# Patient Record
Sex: Female | Born: 1970 | Hispanic: Yes | Marital: Married
Health system: Southern US, Community
[De-identification: ages and names within clinical notes are randomized; demographics above are authoritative.]

---

## 2019-03-25 DIAGNOSIS — M25512 Pain in left shoulder: Secondary | ICD-10-CM | POA: Diagnosis not present

## 2019-03-25 DIAGNOSIS — E041 Nontoxic single thyroid nodule: Secondary | ICD-10-CM | POA: Diagnosis not present

## 2019-03-25 DIAGNOSIS — R918 Other nonspecific abnormal finding of lung field: Secondary | ICD-10-CM | POA: Diagnosis not present

## 2019-03-25 DIAGNOSIS — K5909 Other constipation: Secondary | ICD-10-CM | POA: Diagnosis not present

## 2019-04-22 DIAGNOSIS — G609 Hereditary and idiopathic neuropathy, unspecified: Secondary | ICD-10-CM | POA: Diagnosis not present

## 2019-04-22 DIAGNOSIS — M25512 Pain in left shoulder: Secondary | ICD-10-CM | POA: Diagnosis not present

## 2019-04-22 DIAGNOSIS — K5909 Other constipation: Secondary | ICD-10-CM | POA: Diagnosis not present

## 2019-04-22 DIAGNOSIS — R918 Other nonspecific abnormal finding of lung field: Secondary | ICD-10-CM | POA: Diagnosis not present

## 2019-05-02 DIAGNOSIS — Z6832 Body mass index (BMI) 32.0-32.9, adult: Secondary | ICD-10-CM | POA: Diagnosis not present

## 2019-05-02 DIAGNOSIS — E042 Nontoxic multinodular goiter: Secondary | ICD-10-CM | POA: Diagnosis not present

## 2019-05-10 DIAGNOSIS — R918 Other nonspecific abnormal finding of lung field: Secondary | ICD-10-CM | POA: Diagnosis not present

## 2019-05-10 DIAGNOSIS — K5909 Other constipation: Secondary | ICD-10-CM | POA: Diagnosis not present

## 2019-05-10 DIAGNOSIS — Z0000 Encounter for general adult medical examination without abnormal findings: Secondary | ICD-10-CM | POA: Diagnosis not present

## 2019-05-10 DIAGNOSIS — M25512 Pain in left shoulder: Secondary | ICD-10-CM | POA: Diagnosis not present

## 2019-07-07 DIAGNOSIS — H1030 Unspecified acute conjunctivitis, unspecified eye: Secondary | ICD-10-CM | POA: Diagnosis not present

## 2019-07-07 DIAGNOSIS — K5909 Other constipation: Secondary | ICD-10-CM | POA: Diagnosis not present

## 2019-07-07 DIAGNOSIS — R918 Other nonspecific abnormal finding of lung field: Secondary | ICD-10-CM | POA: Diagnosis not present

## 2019-07-07 DIAGNOSIS — R300 Dysuria: Secondary | ICD-10-CM | POA: Diagnosis not present

## 2019-08-02 DIAGNOSIS — J028 Acute pharyngitis due to other specified organisms: Secondary | ICD-10-CM | POA: Diagnosis not present

## 2019-08-02 DIAGNOSIS — M159 Polyosteoarthritis, unspecified: Secondary | ICD-10-CM | POA: Diagnosis not present

## 2019-08-02 DIAGNOSIS — K5909 Other constipation: Secondary | ICD-10-CM | POA: Diagnosis not present

## 2019-08-02 DIAGNOSIS — R918 Other nonspecific abnormal finding of lung field: Secondary | ICD-10-CM | POA: Diagnosis not present

## 2019-08-11 DIAGNOSIS — R918 Other nonspecific abnormal finding of lung field: Secondary | ICD-10-CM | POA: Diagnosis not present

## 2019-08-11 DIAGNOSIS — J028 Acute pharyngitis due to other specified organisms: Secondary | ICD-10-CM | POA: Diagnosis not present

## 2019-08-11 DIAGNOSIS — K5909 Other constipation: Secondary | ICD-10-CM | POA: Diagnosis not present

## 2019-08-11 DIAGNOSIS — E041 Nontoxic single thyroid nodule: Secondary | ICD-10-CM | POA: Diagnosis not present

## 2019-09-06 DIAGNOSIS — R918 Other nonspecific abnormal finding of lung field: Secondary | ICD-10-CM | POA: Diagnosis not present

## 2019-09-06 DIAGNOSIS — K5909 Other constipation: Secondary | ICD-10-CM | POA: Diagnosis not present

## 2019-09-06 DIAGNOSIS — E041 Nontoxic single thyroid nodule: Secondary | ICD-10-CM | POA: Diagnosis not present

## 2019-09-06 DIAGNOSIS — Z1331 Encounter for screening for depression: Secondary | ICD-10-CM | POA: Diagnosis not present

## 2019-09-06 DIAGNOSIS — M25512 Pain in left shoulder: Secondary | ICD-10-CM | POA: Diagnosis not present

## 2019-09-23 DIAGNOSIS — Z1231 Encounter for screening mammogram for malignant neoplasm of breast: Secondary | ICD-10-CM | POA: Diagnosis not present

## 2019-12-06 DIAGNOSIS — K5909 Other constipation: Secondary | ICD-10-CM | POA: Diagnosis not present

## 2019-12-06 DIAGNOSIS — R5382 Chronic fatigue, unspecified: Secondary | ICD-10-CM | POA: Diagnosis not present

## 2019-12-06 DIAGNOSIS — R918 Other nonspecific abnormal finding of lung field: Secondary | ICD-10-CM | POA: Diagnosis not present

## 2019-12-06 DIAGNOSIS — J028 Acute pharyngitis due to other specified organisms: Secondary | ICD-10-CM | POA: Diagnosis not present

## 2019-12-06 DIAGNOSIS — M25512 Pain in left shoulder: Secondary | ICD-10-CM | POA: Diagnosis not present

## 2019-12-06 DIAGNOSIS — E041 Nontoxic single thyroid nodule: Secondary | ICD-10-CM | POA: Diagnosis not present

## 2019-12-20 DIAGNOSIS — E041 Nontoxic single thyroid nodule: Secondary | ICD-10-CM | POA: Diagnosis not present

## 2019-12-20 DIAGNOSIS — N926 Irregular menstruation, unspecified: Secondary | ICD-10-CM | POA: Diagnosis not present

## 2019-12-20 DIAGNOSIS — K5909 Other constipation: Secondary | ICD-10-CM | POA: Diagnosis not present

## 2019-12-20 DIAGNOSIS — R918 Other nonspecific abnormal finding of lung field: Secondary | ICD-10-CM | POA: Diagnosis not present

## 2019-12-20 DIAGNOSIS — M25512 Pain in left shoulder: Secondary | ICD-10-CM | POA: Diagnosis not present

## 2020-01-17 DIAGNOSIS — K5909 Other constipation: Secondary | ICD-10-CM | POA: Diagnosis not present

## 2020-01-17 DIAGNOSIS — E041 Nontoxic single thyroid nodule: Secondary | ICD-10-CM | POA: Diagnosis not present

## 2020-01-17 DIAGNOSIS — R918 Other nonspecific abnormal finding of lung field: Secondary | ICD-10-CM | POA: Diagnosis not present

## 2020-01-17 DIAGNOSIS — M25512 Pain in left shoulder: Secondary | ICD-10-CM | POA: Diagnosis not present

## 2020-02-21 DIAGNOSIS — K5909 Other constipation: Secondary | ICD-10-CM | POA: Diagnosis not present

## 2020-02-21 DIAGNOSIS — R918 Other nonspecific abnormal finding of lung field: Secondary | ICD-10-CM | POA: Diagnosis not present

## 2020-02-21 DIAGNOSIS — E041 Nontoxic single thyroid nodule: Secondary | ICD-10-CM | POA: Diagnosis not present

## 2020-02-21 DIAGNOSIS — M25512 Pain in left shoulder: Secondary | ICD-10-CM | POA: Diagnosis not present

## 2020-03-05 DIAGNOSIS — E041 Nontoxic single thyroid nodule: Secondary | ICD-10-CM | POA: Diagnosis not present

## 2020-03-05 DIAGNOSIS — E042 Nontoxic multinodular goiter: Secondary | ICD-10-CM | POA: Diagnosis not present

## 2020-03-06 DIAGNOSIS — M159 Polyosteoarthritis, unspecified: Secondary | ICD-10-CM | POA: Diagnosis not present

## 2020-03-06 DIAGNOSIS — M25552 Pain in left hip: Secondary | ICD-10-CM | POA: Diagnosis not present

## 2020-03-06 DIAGNOSIS — R918 Other nonspecific abnormal finding of lung field: Secondary | ICD-10-CM | POA: Diagnosis not present

## 2020-03-06 DIAGNOSIS — K5909 Other constipation: Secondary | ICD-10-CM | POA: Diagnosis not present

## 2021-10-07 ENCOUNTER — Other Ambulatory Visit: Payer: Self-pay | Admitting: Family Medicine

## 2021-10-07 DIAGNOSIS — E0590 Thyrotoxicosis, unspecified without thyrotoxic crisis or storm: Secondary | ICD-10-CM

## 2021-10-18 ENCOUNTER — Ambulatory Visit
Admission: RE | Admit: 2021-10-18 | Discharge: 2021-10-18 | Disposition: A | Discharge status: Home / Self Care | Payer: No Typology Code available for payment source | Source: Ambulatory Visit | Attending: Family Medicine | Admitting: Family Medicine

## 2021-10-18 DIAGNOSIS — E0590 Thyrotoxicosis, unspecified without thyrotoxic crisis or storm: Secondary | ICD-10-CM

## 2021-10-26 ENCOUNTER — Other Ambulatory Visit: Payer: Self-pay | Admitting: Family Medicine

## 2021-10-26 DIAGNOSIS — E041 Nontoxic single thyroid nodule: Secondary | ICD-10-CM

## 2021-11-10 ENCOUNTER — Other Ambulatory Visit (HOSPITAL_COMMUNITY)
Admission: RE | Admit: 2021-11-10 | Discharge: 2021-11-10 | Disposition: A | Discharge status: Home / Self Care | Payer: No Typology Code available for payment source | Source: Ambulatory Visit | Attending: Family Medicine | Admitting: Family Medicine

## 2021-11-10 ENCOUNTER — Ambulatory Visit
Admission: RE | Admit: 2021-11-10 | Discharge: 2021-11-10 | Disposition: A | Discharge status: Home / Self Care | Payer: No Typology Code available for payment source | Source: Ambulatory Visit | Attending: Family Medicine | Admitting: Family Medicine

## 2021-11-10 DIAGNOSIS — E041 Nontoxic single thyroid nodule: Secondary | ICD-10-CM | POA: Insufficient documentation

## 2021-11-11 LAB — CYTOLOGY - NON PAP

## 2022-03-08 ENCOUNTER — Other Ambulatory Visit: Payer: Self-pay

## 2022-03-08 ENCOUNTER — Emergency Department (HOSPITAL_COMMUNITY): Payer: No Typology Code available for payment source

## 2022-03-08 ENCOUNTER — Inpatient Hospital Stay (HOSPITAL_COMMUNITY)
Admission: EM | Admit: 2022-03-08 | Discharge: 2022-03-10 | DRG: 565 | Disposition: A | Discharge status: Home / Self Care | Payer: No Typology Code available for payment source | Attending: Surgery | Admitting: Surgery

## 2022-03-08 DIAGNOSIS — S20212A Contusion of left front wall of thorax, initial encounter: Secondary | ICD-10-CM | POA: Diagnosis present

## 2022-03-08 DIAGNOSIS — S2220XA Unspecified fracture of sternum, initial encounter for closed fracture: Principal | ICD-10-CM | POA: Diagnosis present

## 2022-03-08 DIAGNOSIS — R402412 Glasgow coma scale score 13-15, at arrival to emergency department: Secondary | ICD-10-CM | POA: Diagnosis present

## 2022-03-08 DIAGNOSIS — S22060A Wedge compression fracture of T7-T8 vertebra, initial encounter for closed fracture: Secondary | ICD-10-CM

## 2022-03-08 DIAGNOSIS — S92912A Unspecified fracture of left toe(s), initial encounter for closed fracture: Secondary | ICD-10-CM

## 2022-03-08 DIAGNOSIS — S301XXA Contusion of abdominal wall, initial encounter: Secondary | ICD-10-CM | POA: Diagnosis present

## 2022-03-08 DIAGNOSIS — E041 Nontoxic single thyroid nodule: Secondary | ICD-10-CM | POA: Diagnosis present

## 2022-03-08 DIAGNOSIS — S060X1A Concussion with loss of consciousness of 30 minutes or less, initial encounter: Secondary | ICD-10-CM | POA: Diagnosis present

## 2022-03-08 DIAGNOSIS — Y92410 Unspecified street and highway as the place of occurrence of the external cause: Secondary | ICD-10-CM

## 2022-03-08 DIAGNOSIS — S22069A Unspecified fracture of T7-T8 vertebra, initial encounter for closed fracture: Secondary | ICD-10-CM | POA: Diagnosis present

## 2022-03-08 DIAGNOSIS — R262 Difficulty in walking, not elsewhere classified: Secondary | ICD-10-CM | POA: Diagnosis present

## 2022-03-08 DIAGNOSIS — S1091XA Abrasion of unspecified part of neck, initial encounter: Secondary | ICD-10-CM | POA: Diagnosis present

## 2022-03-08 DIAGNOSIS — S92502A Displaced unspecified fracture of left lesser toe(s), initial encounter for closed fracture: Secondary | ICD-10-CM | POA: Diagnosis present

## 2022-03-08 LAB — COMPREHENSIVE METABOLIC PANEL
ALT: 22 U/L (ref 0–44)
AST: 24 U/L (ref 15–41)
Albumin: 3.3 g/dL — ABNORMAL LOW (ref 3.5–5.0)
Alkaline Phosphatase: 182 U/L — ABNORMAL HIGH (ref 38–126)
Anion gap: 7 (ref 5–15)
BUN: 8 mg/dL (ref 6–20)
CO2: 23 mmol/L (ref 22–32)
Calcium: 9.1 mg/dL (ref 8.9–10.3)
Chloride: 107 mmol/L (ref 98–111)
Creatinine, Ser: 0.46 mg/dL (ref 0.44–1.00)
GFR, Estimated: >60 mL/min (ref >60)
Glucose, Bld: 162 mg/dL — ABNORMAL HIGH (ref 70–99)
Potassium: 3.7 mmol/L (ref 3.5–5.1)
Sodium: 137 mmol/L (ref 135–145)
Total Bilirubin: 0.8 mg/dL (ref 0.3–1.2)
Total Protein: 6.9 g/dL (ref 6.5–8.1)

## 2022-03-08 LAB — I-STAT CHEM 8, ED
BUN: 7 mg/dL (ref 6–20)
Calcium, Ion: 1.16 mmol/L (ref 1.15–1.40)
Chloride: 103 mmol/L (ref 98–111)
Creatinine, Ser: <0.2 mg/dL — ABNORMAL LOW (ref 0.44–1.00)
Glucose, Bld: 169 mg/dL — ABNORMAL HIGH (ref 70–99)
HCT: 36 % (ref 36.0–46.0)
Hemoglobin: 12.2 g/dL (ref 12.0–15.0)
Potassium: 3.7 mmol/L (ref 3.5–5.1)
Sodium: 138 mmol/L (ref 135–145)
TCO2: 20 mmol/L — ABNORMAL LOW (ref 22–32)

## 2022-03-08 LAB — CBC
HCT: 37 % (ref 36.0–46.0)
Hemoglobin: 11.7 g/dL — ABNORMAL LOW (ref 12.0–15.0)
MCH: 24.5 pg — ABNORMAL LOW (ref 26.0–34.0)
MCHC: 31.6 g/dL (ref 30.0–36.0)
MCV: 77.4 fL — ABNORMAL LOW (ref 80.0–100.0)
Platelets: 294 10*3/uL (ref 150–400)
RBC: 4.78 MIL/uL (ref 3.87–5.11)
RDW: 15 % (ref 11.5–15.5)
WBC: 18.1 10*3/uL — ABNORMAL HIGH (ref 4.0–10.5)
nRBC: 0 % (ref 0.0–0.2)

## 2022-03-08 LAB — ETHANOL: Alcohol, Ethyl (B): <10 mg/dL (ref <10)

## 2022-03-08 LAB — SAMPLE TO BLOOD BANK

## 2022-03-08 LAB — I-STAT BETA HCG BLOOD, ED (MC, WL, AP ONLY): I-stat hCG, quantitative: <5 m[IU]/mL (ref <5)

## 2022-03-08 LAB — PROTIME-INR
INR: 1.3 — ABNORMAL HIGH (ref 0.8–1.2)
Prothrombin Time: 15.8 seconds — ABNORMAL HIGH (ref 11.4–15.2)

## 2022-03-08 LAB — LACTIC ACID, PLASMA: Lactic Acid, Venous: 2.5 mmol/L (ref 0.5–1.9)

## 2022-03-08 MED ORDER — ONDANSETRON HCL 4 MG/2ML IJ SOLN
4.0000 mg | Freq: Once | INTRAMUSCULAR | Status: AC
Start: 2022-03-08 — End: 2022-03-08
  Administered 2022-03-08: 4 mg via INTRAVENOUS
  Filled 2022-03-08: qty 2

## 2022-03-08 MED ORDER — IOHEXOL 300 MG/ML  SOLN
100.0000 mL | Freq: Once | INTRAMUSCULAR | Status: AC | PRN
  Administered 2022-03-08: 100 mL via INTRAVENOUS

## 2022-03-08 MED ORDER — MORPHINE SULFATE (PF) 4 MG/ML IV SOLN
4.0000 mg | Freq: Once | INTRAVENOUS | Status: AC
  Administered 2022-03-08: 4 mg via INTRAVENOUS
  Filled 2022-03-08: qty 1

## 2022-03-08 MED ORDER — SODIUM CHLORIDE 0.9 % IV BOLUS
1000.0000 mL | Freq: Once | INTRAVENOUS | Status: AC
  Administered 2022-03-08: 1000 mL via INTRAVENOUS
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
## 2022-03-08 NOTE — ED Notes (Signed)
Lactic acid of 2.5 reported to Dr Particia Nearing MD
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
## 2022-03-08 NOTE — ED Notes (Signed)
X-ray at bedside

## 2022-03-08 NOTE — ED Notes (Signed)
Patient transported to CT 
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
## 2022-03-09 ENCOUNTER — Emergency Department (HOSPITAL_COMMUNITY): Payer: No Typology Code available for payment source

## 2022-03-09 DIAGNOSIS — S92502A Displaced unspecified fracture of left lesser toe(s), initial encounter for closed fracture: Secondary | ICD-10-CM | POA: Diagnosis present

## 2022-03-09 DIAGNOSIS — Y92410 Unspecified street and highway as the place of occurrence of the external cause: Secondary | ICD-10-CM | POA: Diagnosis not present

## 2022-03-09 DIAGNOSIS — R262 Difficulty in walking, not elsewhere classified: Secondary | ICD-10-CM | POA: Diagnosis present

## 2022-03-09 DIAGNOSIS — S22069A Unspecified fracture of T7-T8 vertebra, initial encounter for closed fracture: Secondary | ICD-10-CM | POA: Diagnosis present

## 2022-03-09 DIAGNOSIS — E041 Nontoxic single thyroid nodule: Secondary | ICD-10-CM | POA: Diagnosis present

## 2022-03-09 DIAGNOSIS — S060X1A Concussion with loss of consciousness of 30 minutes or less, initial encounter: Secondary | ICD-10-CM | POA: Diagnosis present

## 2022-03-09 DIAGNOSIS — S20212A Contusion of left front wall of thorax, initial encounter: Secondary | ICD-10-CM | POA: Diagnosis present

## 2022-03-09 DIAGNOSIS — S2220XA Unspecified fracture of sternum, initial encounter for closed fracture: Secondary | ICD-10-CM | POA: Diagnosis present

## 2022-03-09 DIAGNOSIS — S1091XA Abrasion of unspecified part of neck, initial encounter: Secondary | ICD-10-CM | POA: Diagnosis present

## 2022-03-09 DIAGNOSIS — S301XXA Contusion of abdominal wall, initial encounter: Secondary | ICD-10-CM | POA: Diagnosis present

## 2022-03-09 DIAGNOSIS — R402412 Glasgow coma scale score 13-15, at arrival to emergency department: Secondary | ICD-10-CM | POA: Diagnosis present

## 2022-03-09 LAB — URINALYSIS, ROUTINE W REFLEX MICROSCOPIC
Bilirubin Urine: NEGATIVE
Glucose, UA: NEGATIVE mg/dL
Hgb urine dipstick: NEGATIVE
Ketones, ur: NEGATIVE mg/dL
Leukocytes,Ua: NEGATIVE
Nitrite: NEGATIVE
Protein, ur: NEGATIVE mg/dL
Specific Gravity, Urine: 1.021 (ref 1.005–1.030)
pH: 7 (ref 5.0–8.0)

## 2022-03-09 LAB — CBC
HCT: 33.9 % — ABNORMAL LOW (ref 36.0–46.0)
Hemoglobin: 11 g/dL — ABNORMAL LOW (ref 12.0–15.0)
MCH: 25 pg — ABNORMAL LOW (ref 26.0–34.0)
MCHC: 32.4 g/dL (ref 30.0–36.0)
MCV: 77 fL — ABNORMAL LOW (ref 80.0–100.0)
Platelets: 254 10*3/uL (ref 150–400)
RBC: 4.4 MIL/uL (ref 3.87–5.11)
RDW: 15.3 % (ref 11.5–15.5)
WBC: 10.3 10*3/uL (ref 4.0–10.5)
nRBC: 0 % (ref 0.0–0.2)

## 2022-03-09 LAB — HIV ANTIBODY (ROUTINE TESTING W REFLEX): HIV Screen 4th Generation wRfx: NONREACTIVE

## 2022-03-09 MED ORDER — LACTATED RINGERS IV SOLN: INTRAVENOUS | Status: DC

## 2022-03-09 MED ORDER — KETOROLAC TROMETHAMINE 15 MG/ML IJ SOLN
30.0000 mg | Freq: Four times a day (QID) | INTRAMUSCULAR | Status: DC
  Administered 2022-03-09 – 2022-03-10 (×6): 30 mg via INTRAVENOUS
  Filled 2022-03-09 (×6): qty 2

## 2022-03-09 MED ORDER — MORPHINE SULFATE (PF) 4 MG/ML IV SOLN: 4.0000 mg | INTRAVENOUS | Status: DC | PRN

## 2022-03-09 MED ORDER — METHOCARBAMOL 500 MG PO TABS
1000.0000 mg | ORAL_TABLET | Freq: Three times a day (TID) | ORAL | Status: DC
  Administered 2022-03-09 – 2022-03-10 (×5): 1000 mg via ORAL
  Filled 2022-03-09 (×5): qty 2

## 2022-03-09 MED ORDER — LORAZEPAM 2 MG/ML IJ SOLN
1.0000 mg | Freq: Once | INTRAMUSCULAR | Status: AC
  Administered 2022-03-09: 1 mg via INTRAVENOUS
  Filled 2022-03-09: qty 1

## 2022-03-09 MED ORDER — DOCUSATE SODIUM 100 MG PO CAPS
100.0000 mg | ORAL_CAPSULE | Freq: Two times a day (BID) | ORAL | Status: DC
  Administered 2022-03-09 – 2022-03-10 (×3): 100 mg via ORAL
  Filled 2022-03-09 (×3): qty 1

## 2022-03-09 MED ORDER — MORPHINE SULFATE (PF) 4 MG/ML IV SOLN
4.0000 mg | Freq: Once | INTRAVENOUS | Status: AC
  Administered 2022-03-09: 4 mg via INTRAVENOUS
  Filled 2022-03-09: qty 1

## 2022-03-09 MED ORDER — ONDANSETRON 4 MG PO TBDP
4.0000 mg | ORAL_TABLET | Freq: Four times a day (QID) | ORAL | Status: DC | PRN
  Administered 2022-03-10: 4 mg via ORAL
  Filled 2022-03-09: qty 1

## 2022-03-09 MED ORDER — SODIUM CHLORIDE 0.9 % IV SOLN: Freq: Once | INTRAVENOUS | Status: AC

## 2022-03-09 MED ORDER — ACETAMINOPHEN 500 MG PO TABS
1000.0000 mg | ORAL_TABLET | Freq: Four times a day (QID) | ORAL | Status: DC
Start: 2022-03-09 — End: 2022-03-10
  Administered 2022-03-09 – 2022-03-10 (×6): 1000 mg via ORAL
  Filled 2022-03-09 (×6): qty 2

## 2022-03-09 MED ORDER — ENOXAPARIN SODIUM 30 MG/0.3ML IJ SOSY
30.0000 mg | PREFILLED_SYRINGE | Freq: Two times a day (BID) | INTRAMUSCULAR | Status: DC
  Administered 2022-03-10: 30 mg via SUBCUTANEOUS
  Filled 2022-03-09: qty 0.3

## 2022-03-09 MED ORDER — KETOROLAC TROMETHAMINE 15 MG/ML IJ SOLN
15.0000 mg | Freq: Once | INTRAMUSCULAR | Status: AC
  Administered 2022-03-09: 15 mg via INTRAVENOUS
  Filled 2022-03-09: qty 1

## 2022-03-09 MED ORDER — ONDANSETRON HCL 4 MG/2ML IJ SOLN: 4.0000 mg | Freq: Four times a day (QID) | INTRAMUSCULAR | Status: DC | PRN

## 2022-03-09 MED ORDER — OXYCODONE HCL 5 MG PO TABS
5.0000 mg | ORAL_TABLET | ORAL | Status: DC | PRN
  Administered 2022-03-09: 5 mg via ORAL
  Administered 2022-03-10 (×2): 10 mg via ORAL
  Filled 2022-03-09: qty 1
  Filled 2022-03-09 (×3): qty 2

## 2022-03-09 MED ORDER — SODIUM CHLORIDE 0.9 % IV BOLUS: 1000.0000 mL | Freq: Once | INTRAVENOUS | Status: DC

## 2022-03-09 MED ORDER — OXYCODONE-ACETAMINOPHEN 5-325 MG PO TABS
1.0000 | ORAL_TABLET | Freq: Once | ORAL | Status: AC
  Administered 2022-03-09: 1 via ORAL
  Filled 2022-03-09: qty 1

## 2022-03-09 MED ORDER — LORAZEPAM 2 MG/ML IJ SOLN
0.5000 mg | Freq: Once | INTRAMUSCULAR | Status: AC
  Administered 2022-03-09: 0.5 mg via INTRAVENOUS
  Filled 2022-03-09: qty 1

## 2022-03-09 NOTE — ED Notes (Signed)
X-ray at bedside
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
## 2022-03-09 NOTE — Progress Notes (Signed)
Orthopedic Tech Progress Note Patient Details:  Katherine George 1970-08-20 [id number]  Ortho Devices Type of Ortho Device: Thoracolumbar corset (TLSO), Postop shoe/boot Ortho Device/Splint Location: LLE Ortho Device/Splint Interventions: Ordered, Application, Adjustment   Post Interventions Patient Tolerated: Well Instructions Provided: Adjustment of device, Care of device Verbal order for TLSO received from RN. TLSO and POS fitted to patient, TLSO not yet applied because the patient is still on the cardiac monitor and she also requested to not have the POS on while in bed. [place] A Gerilyn Pilgrim 03/09/2022, 1:33 AM
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
## 2022-03-09 NOTE — ED Notes (Signed)
Patient ambulated down the hallway with assistance.  During ambulation patient heart rate reached to 142 and maintained 135-138 during exercise, oxygen levels noted to drop to 85%RA, RR 35-maintained a range between 89-90%. Patient tolerated ambulation well, expressed feeling worse pain when getting up to a sitting and standing position.   ED provider aware.

## 2022-03-10 ENCOUNTER — Other Ambulatory Visit (HOSPITAL_COMMUNITY): Payer: Self-pay

## 2022-03-10 MED ORDER — METHOCARBAMOL 500 MG PO TABS
1000.0000 mg | ORAL_TABLET | Freq: Three times a day (TID) | ORAL | 0 refills | Status: AC | PRN
  Filled 2022-03-10: qty 80, 14d supply, fill #0

## 2022-03-10 MED ORDER — ACETAMINOPHEN 500 MG PO TABS
1000.0000 mg | ORAL_TABLET | Freq: Four times a day (QID) | ORAL | 0 refills | Status: AC
  Filled 2022-03-10: qty 30, 4d supply, fill #0

## 2022-03-10 MED ORDER — DOCUSATE SODIUM 100 MG PO CAPS
100.0000 mg | ORAL_CAPSULE | Freq: Two times a day (BID) | ORAL | 0 refills | Status: AC
  Filled 2022-03-10: qty 10, 5d supply, fill #0

## 2022-03-10 MED ORDER — OXYCODONE HCL 10 MG PO TABS
10.0000 mg | ORAL_TABLET | Freq: Four times a day (QID) | ORAL | 0 refills | Status: AC | PRN
  Filled 2022-03-10: qty 25, 7d supply, fill #0
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
## 2022-03-10 NOTE — Progress Notes (Signed)
Orthopedic Tech Progress Note Patient Details:  Katherine George 01-Sep-1970 [id number]  Patient has HANGER TLSO BACK BRACE  Patient ID: [name], female   DOB: August 06, 1971, 51 y.o.   MRN: [id number]  [name] 03/10/2022, 12:04 PM
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
## 2022-03-10 NOTE — Progress Notes (Signed)
AVS reviewed with patient including when to call provider, follow up appointments, and medications.  All questions answered.  Patient waiting for daughter to come to take her home and bring clothes.  She is also waiting for DME rolling walker and BSC and TOC pharmacy medications.  PIV and telemetry removed.  Patient alert and oriented on room air. Can be taken to private vehicle via wheelchair.  Patient will need assistance into car.

## 2022-03-17 ENCOUNTER — Ambulatory Visit (INDEPENDENT_AMBULATORY_CARE_PROVIDER_SITE_OTHER): Payer: Self-pay | Admitting: Orthopaedic Surgery

## 2022-03-17 DIAGNOSIS — S92919A Unspecified fracture of unspecified toe(s), initial encounter for closed fracture: Secondary | ICD-10-CM

## 2022-03-17 DIAGNOSIS — S92912A Unspecified fracture of left toe(s), initial encounter for closed fracture: Secondary | ICD-10-CM
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
## 2022-03-20 ENCOUNTER — Other Ambulatory Visit (HOSPITAL_BASED_OUTPATIENT_CLINIC_OR_DEPARTMENT_OTHER): Payer: Self-pay | Admitting: Orthopaedic Surgery

## 2022-03-20 ENCOUNTER — Telehealth: Payer: Self-pay | Admitting: Orthopaedic Surgery

## 2022-03-20 MED ORDER — MELOXICAM 15 MG PO TABS: 15.0000 mg | ORAL_TABLET | Freq: Every day | ORAL | 2 refills | Status: AC

## 2022-03-20 NOTE — Telephone Encounter (Signed)
Pt called stating she was suppose to be prescribe antiinflammatory meds.Please call pt about this matter at [phone]

## 2022-03-22 ENCOUNTER — Ambulatory Visit: Payer: No Typology Code available for payment source | Attending: General Surgery | Admitting: Physical Therapy

## 2022-03-22 ENCOUNTER — Encounter: Payer: Self-pay | Admitting: Physical Therapy

## 2022-03-22 ENCOUNTER — Other Ambulatory Visit: Payer: Self-pay

## 2022-03-22 DIAGNOSIS — M25572 Pain in left ankle and joints of left foot: Secondary | ICD-10-CM | POA: Insufficient documentation

## 2022-03-22 DIAGNOSIS — R2681 Unsteadiness on feet: Secondary | ICD-10-CM | POA: Insufficient documentation

## 2022-03-22 DIAGNOSIS — M546 Pain in thoracic spine: Secondary | ICD-10-CM | POA: Diagnosis present

## 2022-03-22 DIAGNOSIS — Y929 Unspecified place or not applicable: Secondary | ICD-10-CM | POA: Insufficient documentation

## 2022-03-22 DIAGNOSIS — R2689 Other abnormalities of gait and mobility: Secondary | ICD-10-CM | POA: Insufficient documentation

## 2022-03-22 DIAGNOSIS — Y939 Activity, unspecified: Secondary | ICD-10-CM | POA: Insufficient documentation
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
## 2022-04-05 ENCOUNTER — Ambulatory Visit: Payer: No Typology Code available for payment source

## 2022-04-05 DIAGNOSIS — R2681 Unsteadiness on feet: Secondary | ICD-10-CM

## 2022-04-05 DIAGNOSIS — M546 Pain in thoracic spine: Secondary | ICD-10-CM | POA: Diagnosis not present

## 2022-04-05 DIAGNOSIS — M25572 Pain in left ankle and joints of left foot: Secondary | ICD-10-CM

## 2022-04-05 DIAGNOSIS — R2689 Other abnormalities of gait and mobility: Secondary | ICD-10-CM
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
## 2022-04-07 ENCOUNTER — Ambulatory Visit: Payer: No Typology Code available for payment source

## 2022-04-07 DIAGNOSIS — M546 Pain in thoracic spine: Secondary | ICD-10-CM | POA: Diagnosis not present

## 2022-04-07 DIAGNOSIS — M25572 Pain in left ankle and joints of left foot: Secondary | ICD-10-CM

## 2022-04-07 DIAGNOSIS — R2681 Unsteadiness on feet: Secondary | ICD-10-CM

## 2022-04-07 DIAGNOSIS — R2689 Other abnormalities of gait and mobility: Secondary | ICD-10-CM
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
## 2022-04-11 ENCOUNTER — Ambulatory Visit: Payer: No Typology Code available for payment source

## 2022-04-11 DIAGNOSIS — R2689 Other abnormalities of gait and mobility: Secondary | ICD-10-CM

## 2022-04-11 DIAGNOSIS — M546 Pain in thoracic spine: Secondary | ICD-10-CM | POA: Diagnosis not present

## 2022-04-11 DIAGNOSIS — M25572 Pain in left ankle and joints of left foot: Secondary | ICD-10-CM

## 2022-04-11 DIAGNOSIS — R2681 Unsteadiness on feet: Secondary | ICD-10-CM
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
## 2022-04-13 ENCOUNTER — Ambulatory Visit: Payer: No Typology Code available for payment source

## 2022-04-13 DIAGNOSIS — R2681 Unsteadiness on feet: Secondary | ICD-10-CM

## 2022-04-13 DIAGNOSIS — M25572 Pain in left ankle and joints of left foot: Secondary | ICD-10-CM

## 2022-04-13 DIAGNOSIS — M546 Pain in thoracic spine: Secondary | ICD-10-CM

## 2022-04-13 DIAGNOSIS — R2689 Other abnormalities of gait and mobility: Secondary | ICD-10-CM
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
## 2022-04-18 ENCOUNTER — Ambulatory Visit: Payer: Self-pay | Attending: General Surgery

## 2022-04-18 DIAGNOSIS — M546 Pain in thoracic spine: Secondary | ICD-10-CM | POA: Insufficient documentation

## 2022-04-18 DIAGNOSIS — R2681 Unsteadiness on feet: Secondary | ICD-10-CM | POA: Insufficient documentation

## 2022-04-18 DIAGNOSIS — R2689 Other abnormalities of gait and mobility: Secondary | ICD-10-CM | POA: Insufficient documentation

## 2022-04-18 DIAGNOSIS — M25572 Pain in left ankle and joints of left foot: Secondary | ICD-10-CM | POA: Insufficient documentation
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
## 2022-04-20 ENCOUNTER — Ambulatory Visit: Payer: Self-pay

## 2022-04-20 DIAGNOSIS — R2689 Other abnormalities of gait and mobility: Secondary | ICD-10-CM

## 2022-04-20 DIAGNOSIS — R2681 Unsteadiness on feet: Secondary | ICD-10-CM

## 2022-04-20 DIAGNOSIS — M25572 Pain in left ankle and joints of left foot: Secondary | ICD-10-CM

## 2022-04-20 DIAGNOSIS — M546 Pain in thoracic spine: Secondary | ICD-10-CM
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
## 2022-04-25 ENCOUNTER — Ambulatory Visit: Payer: Self-pay

## 2022-04-25 DIAGNOSIS — M25572 Pain in left ankle and joints of left foot: Secondary | ICD-10-CM

## 2022-04-25 DIAGNOSIS — M546 Pain in thoracic spine: Secondary | ICD-10-CM

## 2022-04-25 DIAGNOSIS — R2681 Unsteadiness on feet: Secondary | ICD-10-CM

## 2022-04-25 DIAGNOSIS — R2689 Other abnormalities of gait and mobility: Secondary | ICD-10-CM
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
## 2022-04-27 ENCOUNTER — Telehealth: Payer: Self-pay

## 2022-04-27 ENCOUNTER — Ambulatory Visit: Payer: Self-pay
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
## 2022-04-28 ENCOUNTER — Ambulatory Visit (HOSPITAL_BASED_OUTPATIENT_CLINIC_OR_DEPARTMENT_OTHER): Payer: Self-pay | Admitting: Orthopaedic Surgery

## 2022-04-28 ENCOUNTER — Ambulatory Visit (INDEPENDENT_AMBULATORY_CARE_PROVIDER_SITE_OTHER): Payer: Self-pay | Admitting: Orthopaedic Surgery

## 2022-04-28 DIAGNOSIS — S92919A Unspecified fracture of unspecified toe(s), initial encounter for closed fracture: Secondary | ICD-10-CM

## 2022-04-28 DIAGNOSIS — S92912A Unspecified fracture of left toe(s), initial encounter for closed fracture: Secondary | ICD-10-CM
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
## 2022-05-02 ENCOUNTER — Ambulatory Visit: Payer: Self-pay

## 2022-05-02 DIAGNOSIS — M25572 Pain in left ankle and joints of left foot: Secondary | ICD-10-CM

## 2022-05-02 DIAGNOSIS — R2681 Unsteadiness on feet: Secondary | ICD-10-CM

## 2022-05-02 DIAGNOSIS — M546 Pain in thoracic spine: Secondary | ICD-10-CM

## 2022-05-02 DIAGNOSIS — R2689 Other abnormalities of gait and mobility: Secondary | ICD-10-CM
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
## 2022-05-09 ENCOUNTER — Ambulatory Visit: Payer: Self-pay

## 2022-05-09 DIAGNOSIS — M25572 Pain in left ankle and joints of left foot: Secondary | ICD-10-CM

## 2022-05-09 DIAGNOSIS — R2681 Unsteadiness on feet: Secondary | ICD-10-CM

## 2022-05-09 DIAGNOSIS — R2689 Other abnormalities of gait and mobility: Secondary | ICD-10-CM

## 2022-05-09 DIAGNOSIS — M546 Pain in thoracic spine: Secondary | ICD-10-CM
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
## 2022-05-11 ENCOUNTER — Ambulatory Visit: Payer: Self-pay

## 2022-05-11 DIAGNOSIS — R2681 Unsteadiness on feet: Secondary | ICD-10-CM

## 2022-05-11 DIAGNOSIS — M546 Pain in thoracic spine: Secondary | ICD-10-CM

## 2022-05-11 DIAGNOSIS — M25572 Pain in left ankle and joints of left foot: Secondary | ICD-10-CM

## 2022-05-11 DIAGNOSIS — R2689 Other abnormalities of gait and mobility: Secondary | ICD-10-CM
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
## 2022-05-18 ENCOUNTER — Ambulatory Visit: Payer: Self-pay | Attending: General Surgery

## 2022-05-18 DIAGNOSIS — R2689 Other abnormalities of gait and mobility: Secondary | ICD-10-CM | POA: Insufficient documentation

## 2022-05-18 DIAGNOSIS — M25572 Pain in left ankle and joints of left foot: Secondary | ICD-10-CM | POA: Insufficient documentation

## 2022-05-18 DIAGNOSIS — R2681 Unsteadiness on feet: Secondary | ICD-10-CM | POA: Insufficient documentation

## 2022-05-18 DIAGNOSIS — M546 Pain in thoracic spine: Secondary | ICD-10-CM | POA: Insufficient documentation
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
## 2022-05-24 ENCOUNTER — Other Ambulatory Visit: Payer: Self-pay | Admitting: Student

## 2022-05-24 DIAGNOSIS — S22060A Wedge compression fracture of T7-T8 vertebra, initial encounter for closed fracture: Secondary | ICD-10-CM

## 2022-05-24 DIAGNOSIS — M5412 Radiculopathy, cervical region: Secondary | ICD-10-CM

## 2022-05-31 ENCOUNTER — Ambulatory Visit: Payer: Self-pay

## 2022-05-31 DIAGNOSIS — M546 Pain in thoracic spine: Secondary | ICD-10-CM

## 2022-05-31 DIAGNOSIS — R2681 Unsteadiness on feet: Secondary | ICD-10-CM

## 2022-05-31 DIAGNOSIS — M25572 Pain in left ankle and joints of left foot: Secondary | ICD-10-CM

## 2022-05-31 DIAGNOSIS — R2689 Other abnormalities of gait and mobility: Secondary | ICD-10-CM
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
## 2022-05-31 NOTE — Addendum Note (Signed)
Addended by: Jefm Petty on: 05/31/2022 06:31 PM   Modules accepted: Orders

## 2022-06-06 ENCOUNTER — Ambulatory Visit: Payer: Self-pay

## 2022-06-06 DIAGNOSIS — M25572 Pain in left ankle and joints of left foot: Secondary | ICD-10-CM

## 2022-06-06 DIAGNOSIS — M546 Pain in thoracic spine: Secondary | ICD-10-CM

## 2022-06-06 DIAGNOSIS — R2689 Other abnormalities of gait and mobility: Secondary | ICD-10-CM

## 2022-06-06 DIAGNOSIS — R2681 Unsteadiness on feet: Secondary | ICD-10-CM
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
## 2022-06-07 ENCOUNTER — Ambulatory Visit
Admission: RE | Admit: 2022-06-07 | Discharge: 2022-06-07 | Disposition: A | Discharge status: Home / Self Care | Payer: Self-pay | Source: Ambulatory Visit | Attending: Student | Admitting: Student

## 2022-06-07 ENCOUNTER — Ambulatory Visit
Admission: RE | Admit: 2022-06-07 | Discharge: 2022-06-07 | Disposition: A | Discharge status: Home / Self Care | Payer: No Typology Code available for payment source | Source: Ambulatory Visit | Attending: Student | Admitting: Student

## 2022-06-07 DIAGNOSIS — S22060A Wedge compression fracture of T7-T8 vertebra, initial encounter for closed fracture: Secondary | ICD-10-CM

## 2022-06-07 DIAGNOSIS — M5412 Radiculopathy, cervical region: Secondary | ICD-10-CM

## 2022-06-13 ENCOUNTER — Ambulatory Visit: Payer: Self-pay

## 2022-06-13 DIAGNOSIS — M25572 Pain in left ankle and joints of left foot: Secondary | ICD-10-CM

## 2022-06-13 DIAGNOSIS — R2689 Other abnormalities of gait and mobility: Secondary | ICD-10-CM

## 2022-06-13 DIAGNOSIS — M546 Pain in thoracic spine: Secondary | ICD-10-CM

## 2022-06-13 DIAGNOSIS — R2681 Unsteadiness on feet: Secondary | ICD-10-CM
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
## 2022-06-20 ENCOUNTER — Ambulatory Visit: Payer: Self-pay | Attending: General Surgery

## 2022-06-20 DIAGNOSIS — R2681 Unsteadiness on feet: Secondary | ICD-10-CM | POA: Insufficient documentation

## 2022-06-20 DIAGNOSIS — M546 Pain in thoracic spine: Secondary | ICD-10-CM | POA: Insufficient documentation

## 2022-06-20 DIAGNOSIS — M25572 Pain in left ankle and joints of left foot: Secondary | ICD-10-CM | POA: Insufficient documentation

## 2022-06-20 DIAGNOSIS — R2689 Other abnormalities of gait and mobility: Secondary | ICD-10-CM | POA: Insufficient documentation
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
## 2022-06-28 ENCOUNTER — Ambulatory Visit: Payer: Self-pay

## 2022-06-28 DIAGNOSIS — R2689 Other abnormalities of gait and mobility: Secondary | ICD-10-CM

## 2022-06-28 DIAGNOSIS — M25572 Pain in left ankle and joints of left foot: Secondary | ICD-10-CM

## 2022-06-28 DIAGNOSIS — M546 Pain in thoracic spine: Secondary | ICD-10-CM

## 2022-06-28 DIAGNOSIS — R2681 Unsteadiness on feet: Secondary | ICD-10-CM
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
## 2022-07-05 ENCOUNTER — Ambulatory Visit: Payer: Self-pay

## 2022-07-05 DIAGNOSIS — R2681 Unsteadiness on feet: Secondary | ICD-10-CM

## 2022-07-05 DIAGNOSIS — R2689 Other abnormalities of gait and mobility: Secondary | ICD-10-CM

## 2022-07-05 DIAGNOSIS — M546 Pain in thoracic spine: Secondary | ICD-10-CM
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
## 2022-07-13 ENCOUNTER — Ambulatory Visit: Payer: Self-pay

## 2022-07-13 DIAGNOSIS — R2689 Other abnormalities of gait and mobility: Secondary | ICD-10-CM

## 2022-07-13 DIAGNOSIS — M25572 Pain in left ankle and joints of left foot: Secondary | ICD-10-CM

## 2022-07-13 DIAGNOSIS — R2681 Unsteadiness on feet: Secondary | ICD-10-CM

## 2022-07-13 DIAGNOSIS — M546 Pain in thoracic spine: Secondary | ICD-10-CM
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
## 2022-07-18 ENCOUNTER — Ambulatory Visit: Payer: Self-pay | Attending: General Surgery

## 2022-07-18 DIAGNOSIS — M25572 Pain in left ankle and joints of left foot: Secondary | ICD-10-CM | POA: Insufficient documentation

## 2022-07-18 DIAGNOSIS — R2689 Other abnormalities of gait and mobility: Secondary | ICD-10-CM | POA: Insufficient documentation

## 2022-07-18 DIAGNOSIS — M546 Pain in thoracic spine: Secondary | ICD-10-CM | POA: Insufficient documentation

## 2022-07-18 DIAGNOSIS — R2681 Unsteadiness on feet: Secondary | ICD-10-CM | POA: Insufficient documentation
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
## 2022-07-25 ENCOUNTER — Ambulatory Visit: Payer: Self-pay | Admitting: Physical Therapy

## 2022-07-25 ENCOUNTER — Encounter: Payer: Self-pay | Admitting: Physical Therapy

## 2022-07-25 DIAGNOSIS — M25572 Pain in left ankle and joints of left foot: Secondary | ICD-10-CM

## 2022-07-25 DIAGNOSIS — R2681 Unsteadiness on feet: Secondary | ICD-10-CM

## 2022-07-25 DIAGNOSIS — R2689 Other abnormalities of gait and mobility: Secondary | ICD-10-CM

## 2022-07-25 DIAGNOSIS — M546 Pain in thoracic spine: Secondary | ICD-10-CM
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
## 2022-08-02 ENCOUNTER — Encounter: Payer: Self-pay | Admitting: Physical Therapy

## 2022-08-02 ENCOUNTER — Ambulatory Visit: Payer: Self-pay | Admitting: Physical Therapy

## 2022-08-02 DIAGNOSIS — M25572 Pain in left ankle and joints of left foot: Secondary | ICD-10-CM

## 2022-08-02 DIAGNOSIS — R2689 Other abnormalities of gait and mobility: Secondary | ICD-10-CM

## 2022-08-02 DIAGNOSIS — R2681 Unsteadiness on feet: Secondary | ICD-10-CM

## 2022-08-02 DIAGNOSIS — M546 Pain in thoracic spine: Secondary | ICD-10-CM
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
## 2022-08-11 ENCOUNTER — Ambulatory Visit: Payer: Self-pay

## 2022-08-11 DIAGNOSIS — M25572 Pain in left ankle and joints of left foot: Secondary | ICD-10-CM

## 2022-08-11 DIAGNOSIS — M546 Pain in thoracic spine: Secondary | ICD-10-CM

## 2022-08-11 DIAGNOSIS — R2681 Unsteadiness on feet: Secondary | ICD-10-CM

## 2022-08-11 DIAGNOSIS — R2689 Other abnormalities of gait and mobility: Secondary | ICD-10-CM
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
## 2022-08-15 ENCOUNTER — Ambulatory Visit: Payer: Self-pay | Attending: General Surgery

## 2022-08-15 DIAGNOSIS — M546 Pain in thoracic spine: Secondary | ICD-10-CM | POA: Insufficient documentation

## 2022-08-15 DIAGNOSIS — R2681 Unsteadiness on feet: Secondary | ICD-10-CM | POA: Insufficient documentation

## 2022-08-15 DIAGNOSIS — R2689 Other abnormalities of gait and mobility: Secondary | ICD-10-CM | POA: Insufficient documentation

## 2022-08-15 DIAGNOSIS — M25572 Pain in left ankle and joints of left foot: Secondary | ICD-10-CM | POA: Insufficient documentation
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
## 2022-08-22 ENCOUNTER — Ambulatory Visit: Payer: Self-pay

## 2022-08-22 DIAGNOSIS — M546 Pain in thoracic spine: Secondary | ICD-10-CM

## 2022-08-22 DIAGNOSIS — R2681 Unsteadiness on feet: Secondary | ICD-10-CM

## 2022-08-22 DIAGNOSIS — M25572 Pain in left ankle and joints of left foot: Secondary | ICD-10-CM

## 2022-08-22 DIAGNOSIS — R2689 Other abnormalities of gait and mobility: Secondary | ICD-10-CM
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
## 2022-08-29 ENCOUNTER — Ambulatory Visit: Payer: Self-pay

## 2022-09-05 ENCOUNTER — Ambulatory Visit: Payer: Self-pay

## 2022-09-05 DIAGNOSIS — R2681 Unsteadiness on feet: Secondary | ICD-10-CM

## 2022-09-05 DIAGNOSIS — M25572 Pain in left ankle and joints of left foot: Secondary | ICD-10-CM

## 2022-09-05 DIAGNOSIS — M546 Pain in thoracic spine: Secondary | ICD-10-CM

## 2022-09-05 DIAGNOSIS — R2689 Other abnormalities of gait and mobility: Secondary | ICD-10-CM
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
## 2022-09-12 ENCOUNTER — Ambulatory Visit: Payer: Self-pay

## 2023-02-08 IMAGING — US US THYROID
1 series · 13 of 25 positions shown · non-contrast
Comparison: None.

CLINICAL DATA: Other. Thyrotoxicosis without thyroid storm.
Right-sided thyroid enlargement.

EXAM:
THYROID ULTRASOUND
TECHNIQUE: Ultrasound examination of the thyroid gland and adjacent soft
tissues was performed.

[Series 1: us thyroid · 0.06mm/px · 13 of 47 slices shown]
[im 1/47]
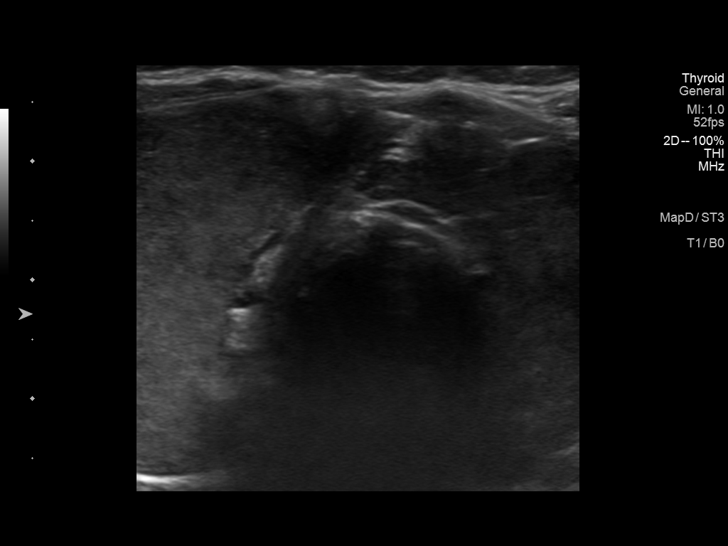
[im 4/47]
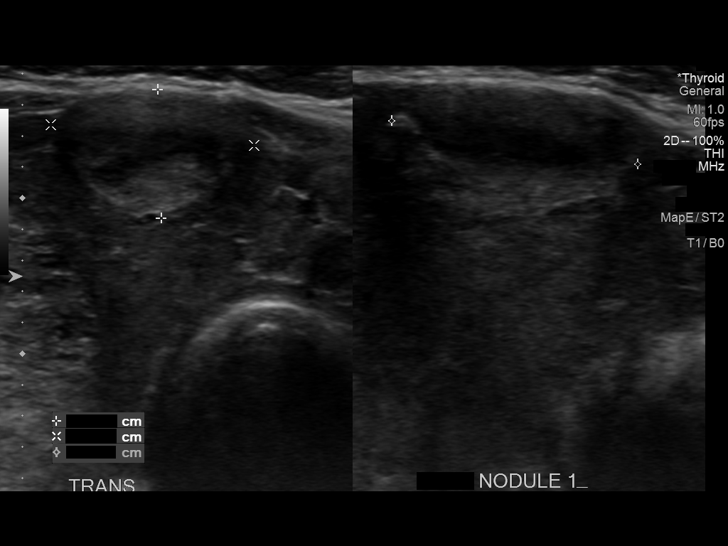
[im 8/47]
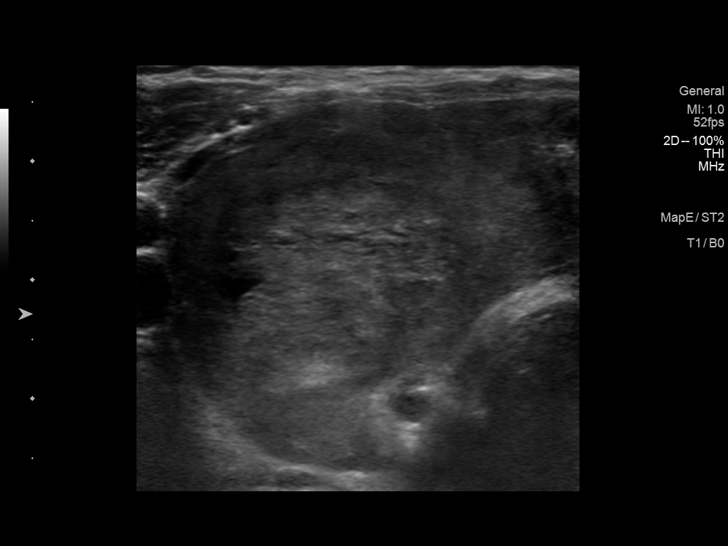
[im 12/47]
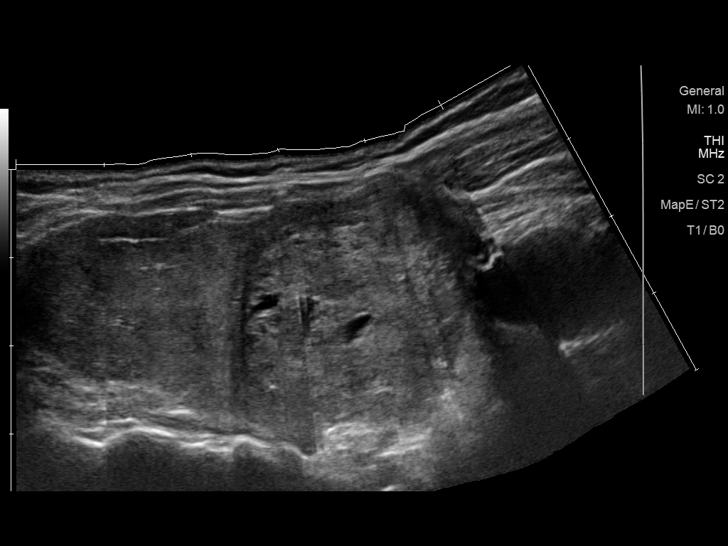
[im 16/47]
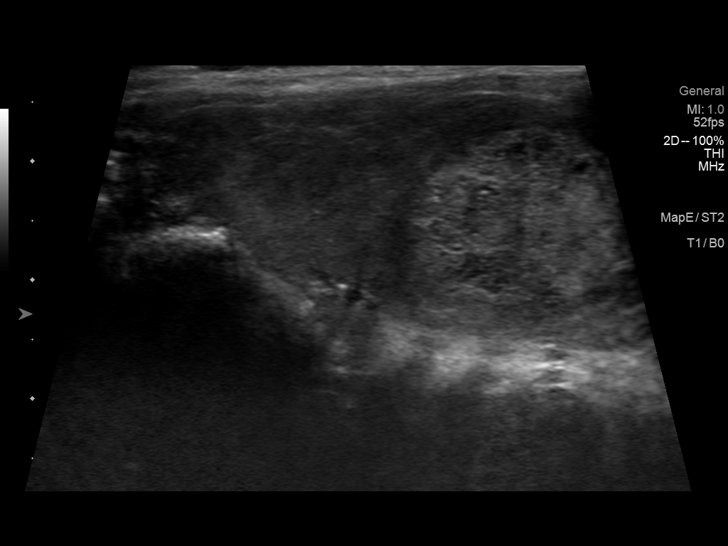
[im 20/47]
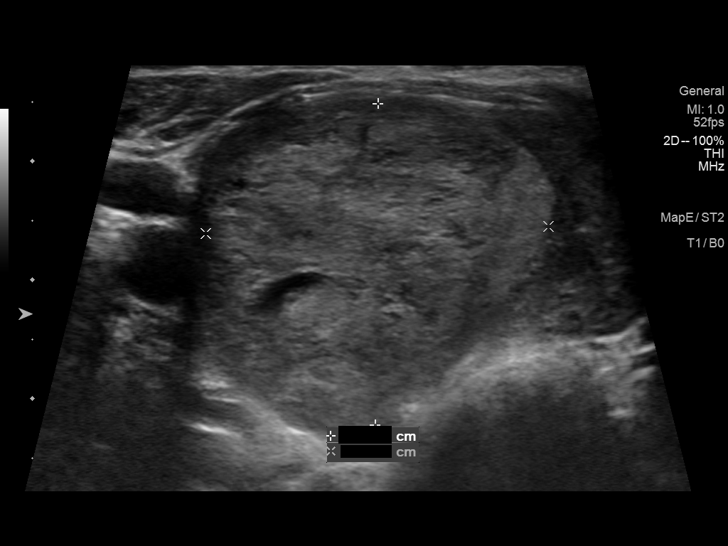
[im 24/47]
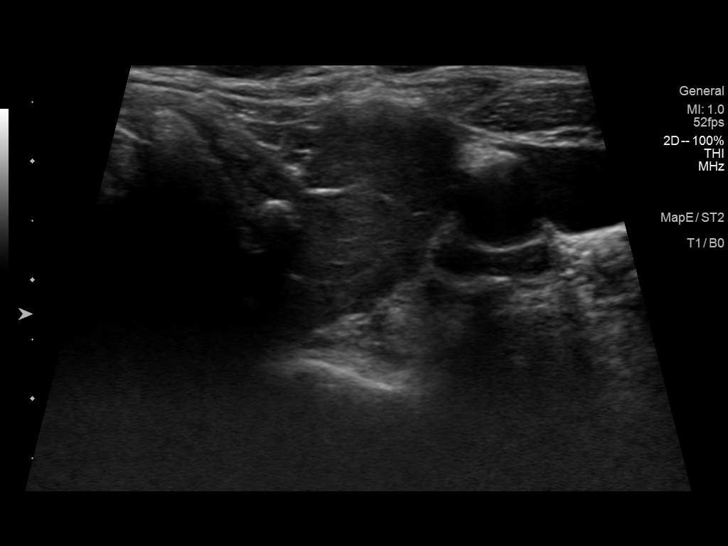
[im 27/47]
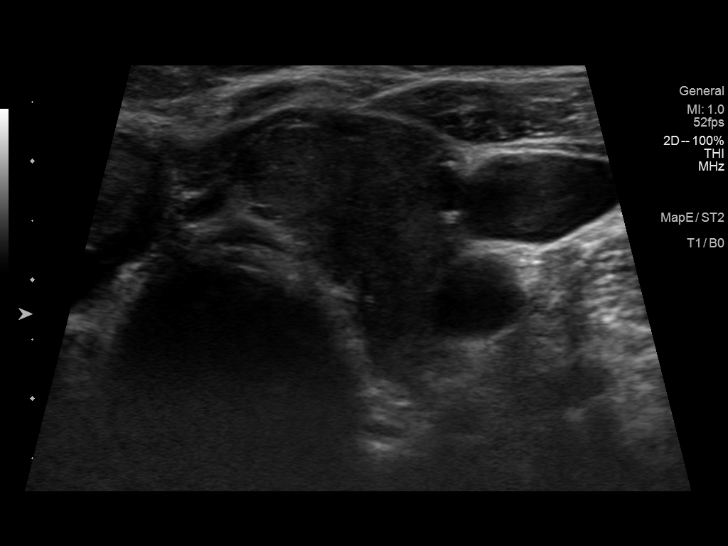
[im 31/47]
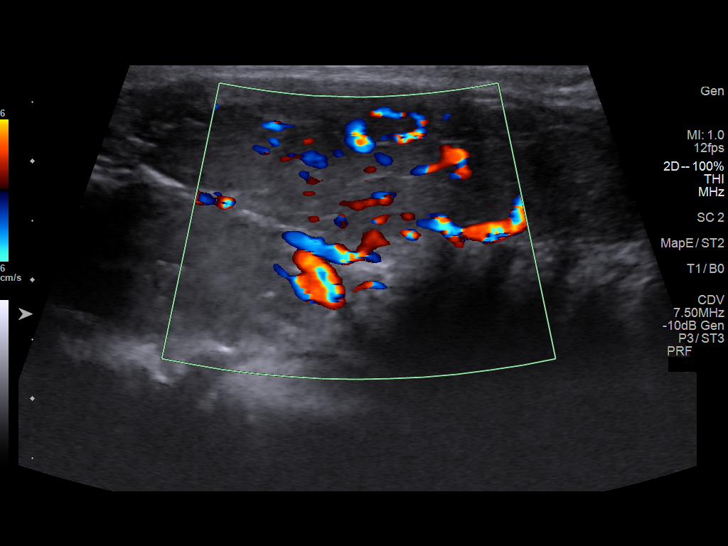
[im 35/47]
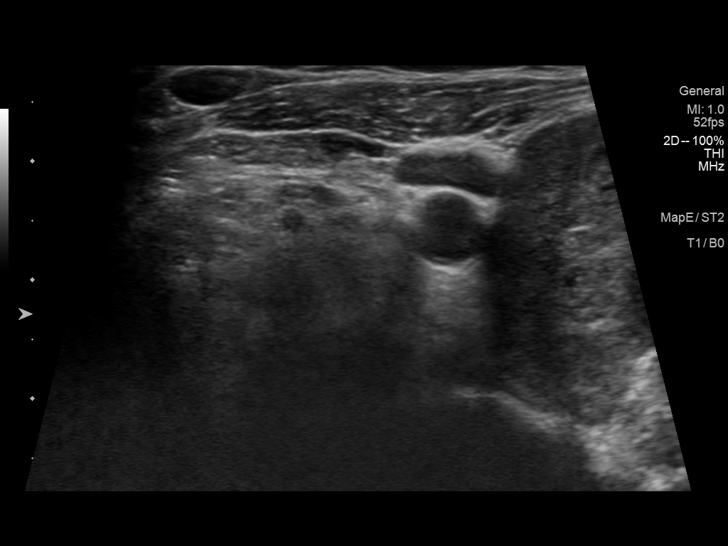
[im 39/47]
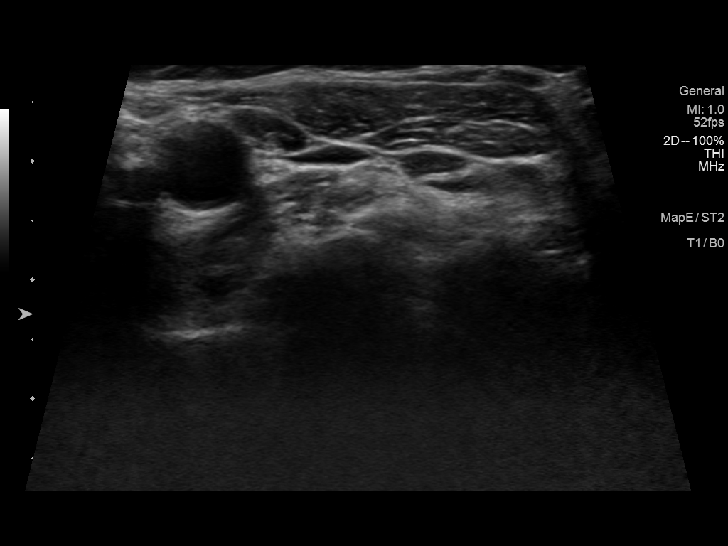
[im 43/47]
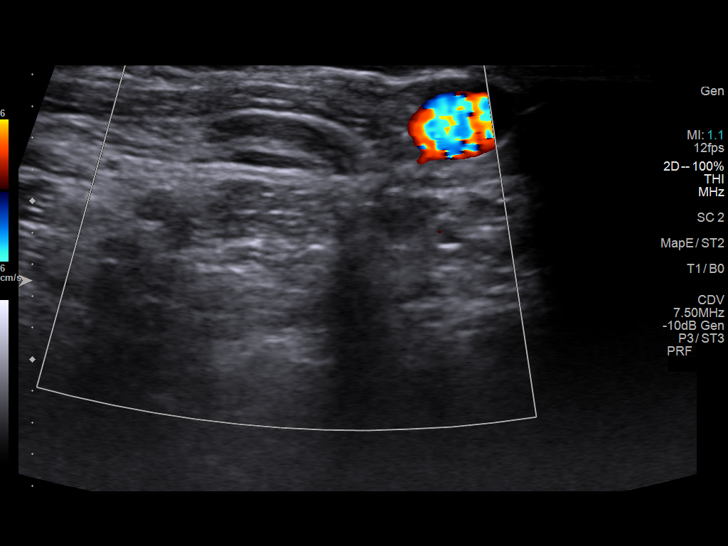
[im 47/47]
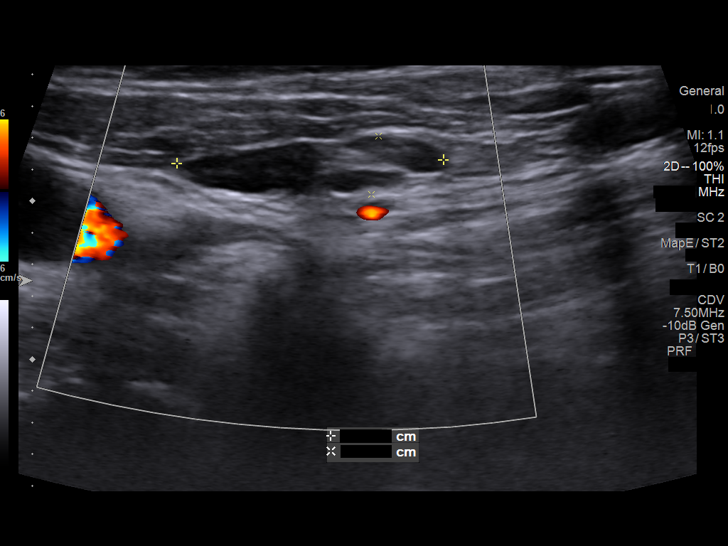

[13 of 25 positions shown; findings below may reference images not displayed]

FINDINGS: Parenchymal Echotexture: Moderately heterogenous - potential diffuse
glandular hyperemia (images 16, 18, 31 and 32).

Isthmus: Enlarged measuring 1 cm in diameter

Right lobe: Enlarged measuring 5.3 x 3.4 x 2.7 cm

Left lobe: Enlarged measuring 5.4 x 2.5 x 1.9 cm

_________________________________________________________

Estimated total number of nodules >/= 1 cm: 2

Number of spongiform nodules >/=  2 cm not described below (TR1): 0

Number of mixed cystic and solid nodules >/= 1.5 cm not described
below (TR2): 0

_________________________________________________________

Nodule # 1:

Location: Isthmus; Mid

Maximum size: 1.6 cm; Other 2 dimensions: 1.3 x 0.8 cm

Composition: solid/almost completely solid (2)

Echogenicity: hyperechoic (1)

Shape: not taller-than-wide (0)

Margins: ill-defined (0)

Echogenic foci: none (0)

ACR TI-RADS total points: 3.

ACR TI-RADS risk category: TR3 (3 points).

ACR TI-RADS recommendations:

*Given size (>/= 1.5 - 2.4 cm) and appearance, a follow-up
ultrasound in 1 year should be considered based on TI-RADS criteria.

_________________________________________________________

Nodule # 2:

Location: Right; Mid

Maximum size: 3.2 cm; Other 2 dimensions: 2.9 x 2.7 cm

Composition: solid/almost completely solid (2)

Echogenicity: isoechoic (1)

Shape: not taller-than-wide (0)

Margins: smooth (0)

Echogenic foci: none (0)

ACR TI-RADS total points: 3.

ACR TI-RADS risk category: TR3 (3 points).

ACR TI-RADS recommendations:

**Given size (>/= 2.5 cm) and appearance, fine needle aspiration of
this mildly suspicious nodule should be considered based on TI-RADS
criteria.

_________________________________________________________

Left cervical lymph nodes are not enlarged by size criteria and
maintain benign fatty hila and thus are presumably reactive in
etiology.
IMPRESSION: 1. Enlarged, moderately heterogeneous appearing and potentially
hyperemic thyroid, nonspecific though compatible with provided
history of thyrotoxicosis.
2. Nodule #2 meets imaging criteria to recommend percutaneous
sampling as indicated.
3. Nodule #1 meets imaging criteria to recommend a 1 year follow-up.

The above is in keeping with the ACR TI-RADS recommendations - [HOSPITAL] [id number];[DATE].

## 2023-03-03 IMAGING — US US FNA BIOPSY THYROID 1ST LESION
1 series · 12 of 12 positions shown · non-contrast
Comparison: US Thyroid 10/18/21

MEDICATIONS:
None

COMPLICATIONS:
None immediate.

INDICATION: Indeterminate thyroid nodule

EXAM:
ULTRASOUND GUIDED FINE NEEDLE ASPIRATION OF INDETERMINATE THYROID
NODULE
TECHNIQUE: Informed written consent was obtained from the patient after a
discussion of the risks, benefits and alternatives to treatment.
Questions regarding the procedure were encouraged and answered. A
timeout was performed prior to the initiation of the procedure.

[Series 1: us fna biopsy thyroid 1st lesion · 0.07mm/px · 12 acquisitions, 12 frames shown]
[im 1/12]
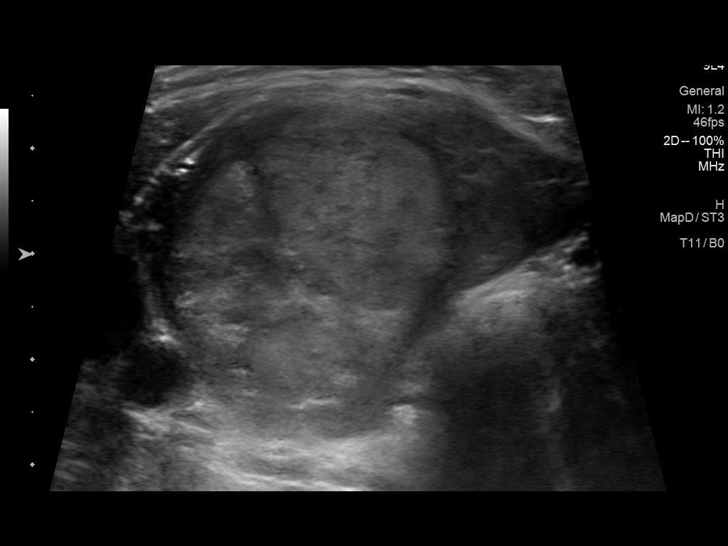
[im 2/12]
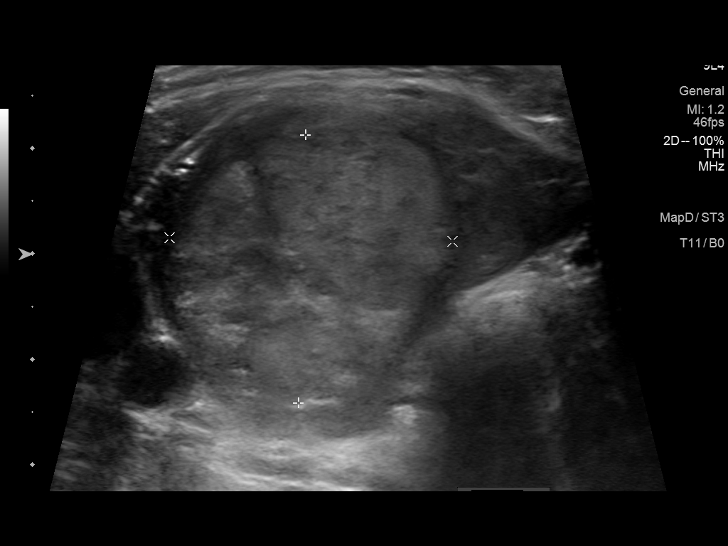
[im 3/12]
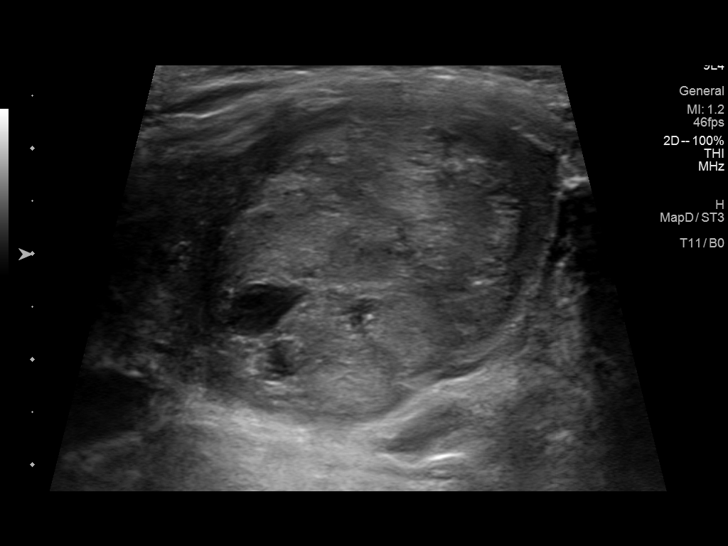
[im 4/12]
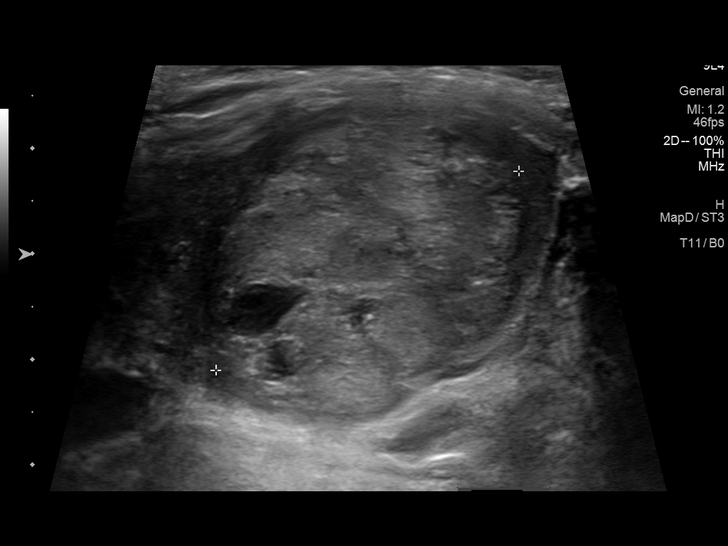
[im 5/12]
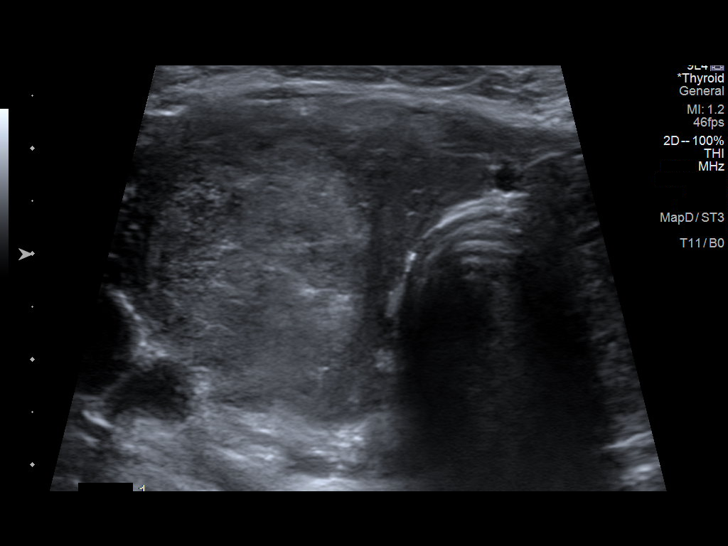
[im 6/12]
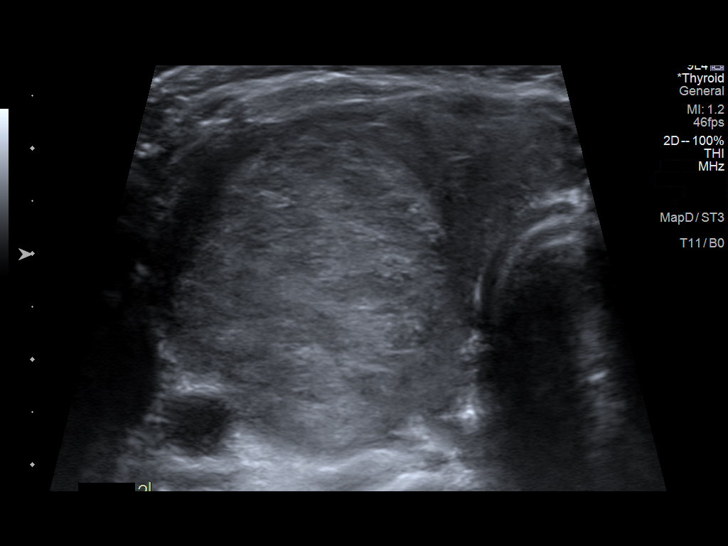
[im 7/12]
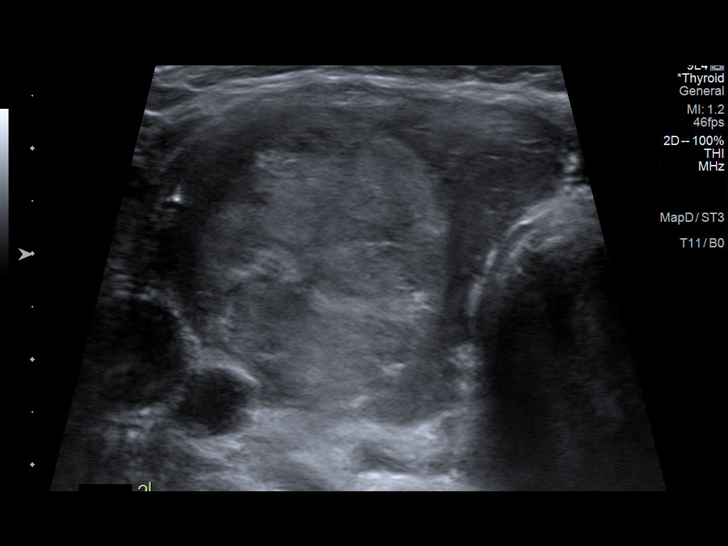
[im 8/12]
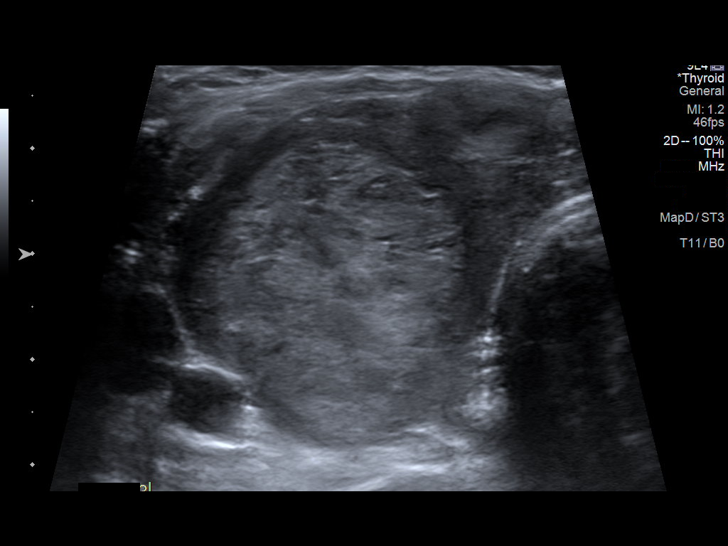
[im 9/12]
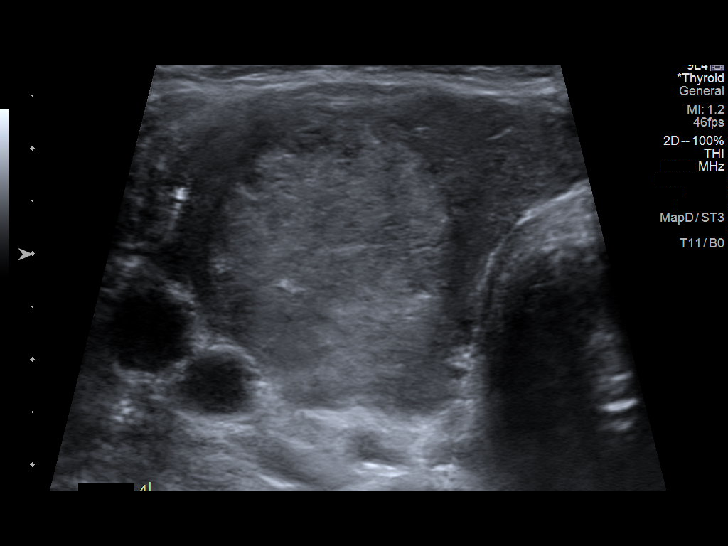
[im 10/12]
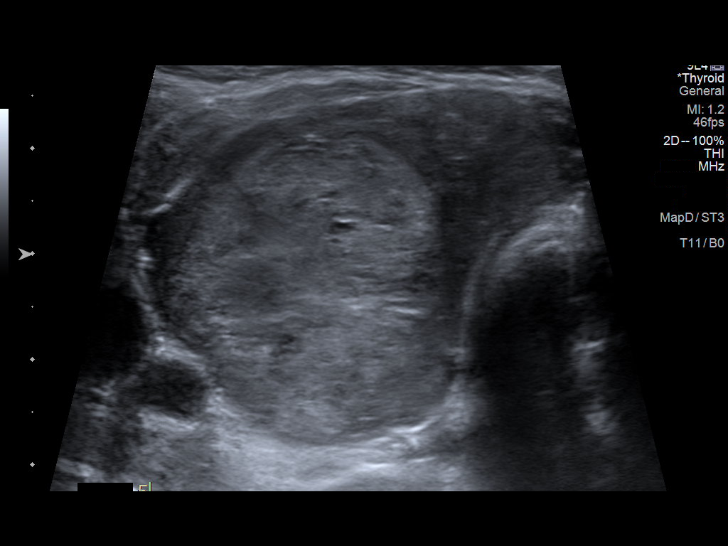
[im 11/12]
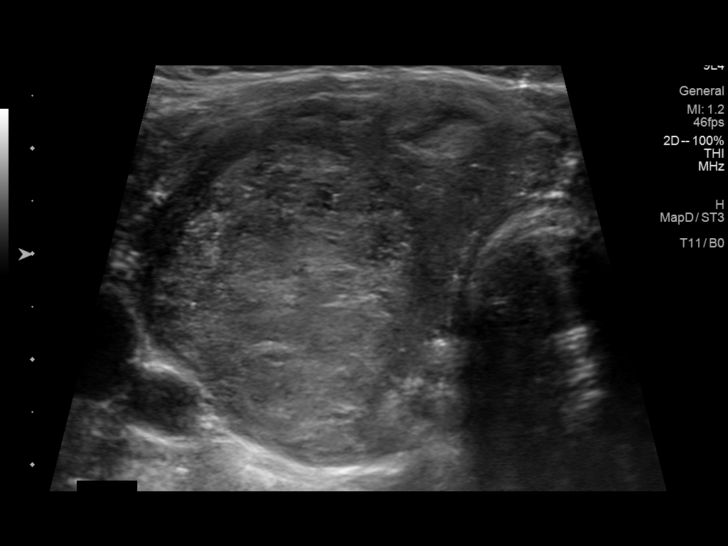
[im 12/12]
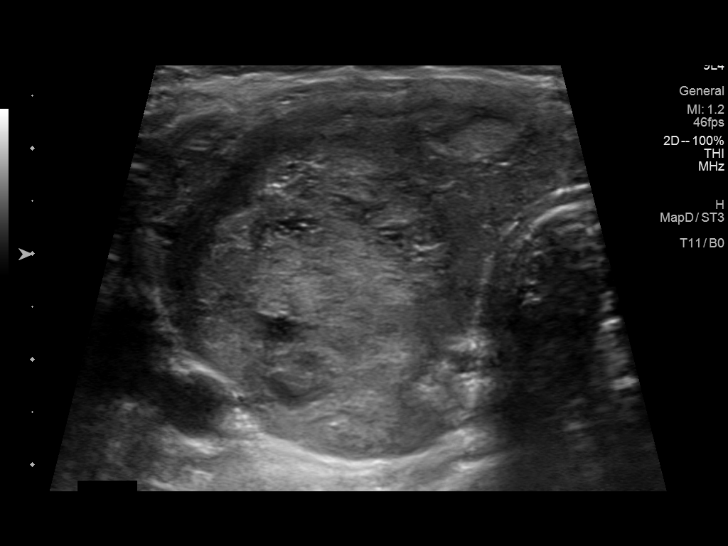

[12 of 12 positions shown; findings below may reference images not displayed]

Pre-procedural ultrasound scanning demonstrated unchanged size and
appearance of the indeterminate nodule within the right lobe of the
thyroid

The procedure was planned. The neck was prepped in the usual sterile
fashion, and a sterile drape was applied covering the operative
field. A timeout was performed prior to the initiation of the
procedure. Local anesthesia was provided with 1% lidocaine.

Under direct ultrasound guidance, 5 FNA biopsies were performed of
the nodule with a 27 gauge needle. Multiple ultrasound images were
saved for procedural documentation purposes. The samples were
prepared and submitted to pathology. Two of the specimens were
reserved for Afirma testing.

Limited post procedural scanning was negative for hematoma or
additional complication. Dressings were placed. The patient
tolerated the above procedures procedure well without immediate
postprocedural complication.
FINDINGS: Nodule reference number based on prior diagnostic ultrasound: 2

Maximum size: 3.2 cm

Location: Right; Mid

ACR TI-RADS risk category: TR3 (3 points)

Reason for biopsy: meets ACR TI-RADS criteria

Ultrasound imaging confirms appropriate placement of the needles
within the thyroid nodule.
IMPRESSION: Technically successful ultrasound guided fine needle aspiration of
right mid lobe thyroid nodule

Performed and read by Memane, Kanjibhai

## 2023-04-10 ENCOUNTER — Other Ambulatory Visit: Payer: Self-pay | Admitting: Family Medicine

## 2023-04-10 DIAGNOSIS — E041 Nontoxic single thyroid nodule: Secondary | ICD-10-CM

## 2023-04-19 ENCOUNTER — Inpatient Hospital Stay: Admission: RE | Admit: 2023-04-19 | Payer: Self-pay | Source: Ambulatory Visit

## 2023-05-04 ENCOUNTER — Other Ambulatory Visit: Payer: Self-pay

## 2023-05-18 ENCOUNTER — Ambulatory Visit
Admission: RE | Admit: 2023-05-18 | Discharge: 2023-05-18 | Disposition: A | Discharge status: Home / Self Care | Payer: No Typology Code available for payment source | Source: Ambulatory Visit | Attending: Family Medicine | Admitting: Family Medicine

## 2023-05-18 DIAGNOSIS — E041 Nontoxic single thyroid nodule: Secondary | ICD-10-CM

## 2023-10-01 ENCOUNTER — Encounter: Payer: Self-pay | Admitting: Family Medicine

## 2023-10-01 ENCOUNTER — Other Ambulatory Visit: Payer: Self-pay | Admitting: Family Medicine

## 2023-10-01 DIAGNOSIS — R102 Pelvic and perineal pain: Secondary | ICD-10-CM

## 2023-10-08 ENCOUNTER — Ambulatory Visit
Admission: RE | Admit: 2023-10-08 | Discharge: 2023-10-08 | Disposition: A | Discharge status: Home / Self Care | Payer: Self-pay | Source: Ambulatory Visit | Attending: Family Medicine | Admitting: Family Medicine

## 2023-10-08 DIAGNOSIS — R102 Pelvic and perineal pain: Secondary | ICD-10-CM

## 2023-11-20 DIAGNOSIS — Z860100 Personal history of colon polyps, unspecified: Secondary | ICD-10-CM | POA: Diagnosis not present

## 2023-11-20 DIAGNOSIS — K635 Polyp of colon: Secondary | ICD-10-CM | POA: Diagnosis not present

## 2024-01-04 DIAGNOSIS — I10 Essential (primary) hypertension: Secondary | ICD-10-CM | POA: Diagnosis not present

## 2024-01-04 DIAGNOSIS — E0590 Thyrotoxicosis, unspecified without thyrotoxic crisis or storm: Secondary | ICD-10-CM | POA: Diagnosis not present

## 2024-01-04 DIAGNOSIS — Z8249 Family history of ischemic heart disease and other diseases of the circulatory system: Secondary | ICD-10-CM | POA: Diagnosis not present

## 2024-01-04 DIAGNOSIS — E669 Obesity, unspecified: Secondary | ICD-10-CM | POA: Diagnosis not present

## 2024-01-04 DIAGNOSIS — Z6833 Body mass index (BMI) 33.0-33.9, adult: Secondary | ICD-10-CM | POA: Diagnosis not present

## 2024-01-25 DIAGNOSIS — E669 Obesity, unspecified: Secondary | ICD-10-CM | POA: Diagnosis not present

## 2024-01-25 DIAGNOSIS — E0590 Thyrotoxicosis, unspecified without thyrotoxic crisis or storm: Secondary | ICD-10-CM | POA: Diagnosis not present

## 2024-01-25 DIAGNOSIS — R7303 Prediabetes: Secondary | ICD-10-CM | POA: Diagnosis not present

## 2024-01-25 DIAGNOSIS — E042 Nontoxic multinodular goiter: Secondary | ICD-10-CM | POA: Diagnosis not present

## 2024-01-25 DIAGNOSIS — R748 Abnormal levels of other serum enzymes: Secondary | ICD-10-CM | POA: Diagnosis not present
